# Patient Record
Sex: Female | Born: 1970 | Race: White | Hispanic: No | Marital: Married | Smoking: Current every day smoker
Health system: Southern US, Community
[De-identification: ages and names within clinical notes are randomized; demographics above are authoritative.]

## PROBLEM LIST (undated history)

## (undated) DIAGNOSIS — G459 Transient cerebral ischemic attack, unspecified: Secondary | ICD-10-CM

## (undated) HISTORY — PX: HX TUBAL LIGATION: SHX77

## (undated) HISTORY — PX: HX HYSTERECTOMY: SHX81

## (undated) HISTORY — PX: TONSILLECTOMY: SUR1361

## (undated) HISTORY — PX: HX GALL BLADDER SURGERY/CHOLE: SHX55

## (undated) HISTORY — PX: TUBAL LIGATION: SHX77

## (undated) HISTORY — PX: CHOLECYSTECTOMY: SHX55

---

## 2000-05-27 ENCOUNTER — Emergency Department (HOSPITAL_COMMUNITY): Admission: EM | Admit: 2000-05-27 | Discharge: 2000-05-28 | Payer: Self-pay | Admitting: Emergency Medicine

## 2000-05-27 ENCOUNTER — Encounter: Payer: Self-pay | Admitting: Emergency Medicine

## 2000-05-28 ENCOUNTER — Encounter: Payer: Self-pay | Admitting: Emergency Medicine

## 2000-06-05 ENCOUNTER — Encounter (HOSPITAL_BASED_OUTPATIENT_CLINIC_OR_DEPARTMENT_OTHER): Payer: Self-pay | Admitting: General Surgery

## 2000-06-06 ENCOUNTER — Encounter (HOSPITAL_BASED_OUTPATIENT_CLINIC_OR_DEPARTMENT_OTHER): Payer: Self-pay | Admitting: General Surgery

## 2000-06-06 ENCOUNTER — Ambulatory Visit (HOSPITAL_COMMUNITY): Admission: RE | Admit: 2000-06-06 | Discharge: 2000-06-07 | Payer: Self-pay | Admitting: General Surgery

## 2001-02-18 ENCOUNTER — Emergency Department (HOSPITAL_COMMUNITY): Admission: EM | Admit: 2001-02-18 | Discharge: 2001-02-18 | Payer: Self-pay | Admitting: Emergency Medicine

## 2004-09-20 ENCOUNTER — Ambulatory Visit (HOSPITAL_COMMUNITY): Payer: Self-pay | Admitting: INTERNAL MEDICINE

## 2007-01-02 ENCOUNTER — Ambulatory Visit (INDEPENDENT_AMBULATORY_CARE_PROVIDER_SITE_OTHER): Payer: Self-pay | Admitting: Neurology

## 2007-02-06 ENCOUNTER — Ambulatory Visit (INDEPENDENT_AMBULATORY_CARE_PROVIDER_SITE_OTHER): Payer: BC Managed Care – PPO | Admitting: Neurology

## 2007-03-13 ENCOUNTER — Ambulatory Visit (INDEPENDENT_AMBULATORY_CARE_PROVIDER_SITE_OTHER): Payer: BC Managed Care – PPO | Admitting: Neurology

## 2007-04-10 ENCOUNTER — Encounter (INDEPENDENT_AMBULATORY_CARE_PROVIDER_SITE_OTHER): Payer: BC Managed Care – PPO | Admitting: Neurology

## 2007-05-08 ENCOUNTER — Encounter (INDEPENDENT_AMBULATORY_CARE_PROVIDER_SITE_OTHER): Payer: BC Managed Care – PPO | Admitting: Neurology

## 2007-08-07 ENCOUNTER — Encounter (INDEPENDENT_AMBULATORY_CARE_PROVIDER_SITE_OTHER): Payer: BC Managed Care – PPO | Admitting: Neurology

## 2014-03-23 DIAGNOSIS — Z029 Encounter for administrative examinations, unspecified: Secondary | ICD-10-CM

## 2016-08-11 ENCOUNTER — Emergency Department: Payer: Medicaid - Out of State

## 2016-08-11 ENCOUNTER — Encounter: Payer: Self-pay | Admitting: Emergency Medicine

## 2016-08-11 ENCOUNTER — Emergency Department
Admission: EM | Admit: 2016-08-11 | Discharge: 2016-08-11 | Disposition: A | Discharge status: Home / Self Care | Payer: Medicaid - Out of State | Attending: Emergency Medicine | Admitting: Emergency Medicine

## 2016-08-11 DIAGNOSIS — F17210 Nicotine dependence, cigarettes, uncomplicated: Secondary | ICD-10-CM | POA: Diagnosis not present

## 2016-08-11 DIAGNOSIS — J209 Acute bronchitis, unspecified: Secondary | ICD-10-CM | POA: Insufficient documentation

## 2016-08-11 DIAGNOSIS — Z720 Tobacco use: Secondary | ICD-10-CM

## 2016-08-11 DIAGNOSIS — R05 Cough: Secondary | ICD-10-CM | POA: Diagnosis present

## 2016-08-11 HISTORY — DX: Transient cerebral ischemic attack, unspecified: G45.9

## 2016-08-11 MED ORDER — ALBUTEROL SULFATE HFA 108 (90 BASE) MCG/ACT IN AERS: 1.0000 | INHALATION_SPRAY | Freq: Four times a day (QID) | RESPIRATORY_TRACT | 0 refills | Status: AC | PRN

## 2016-08-11 MED ORDER — NAPROXEN 500 MG PO TABS: 500.0000 mg | ORAL_TABLET | Freq: Two times a day (BID) | ORAL | 0 refills | Status: AC

## 2016-08-11 MED ORDER — PREDNISONE 10 MG PO TABS: ORAL_TABLET | ORAL | 0 refills | Status: AC

## 2016-08-11 MED ORDER — PROMETHAZINE-DM 6.25-15 MG/5ML PO SYRP: 5.0000 mL | ORAL_SOLUTION | Freq: Four times a day (QID) | ORAL | 0 refills | Status: AC | PRN

## 2016-08-11 MED ORDER — AZITHROMYCIN 250 MG PO TABS: ORAL_TABLET | ORAL | 0 refills | Status: AC

## 2016-08-11 NOTE — ED Triage Notes (Signed)
Patient to ED via POV, patient states that she has had cough, congestion, fever and chills x 1 week. Patient states that she is getting up yellow sputum. Patient is not in any distress at this time.

## 2016-08-11 NOTE — ED Provider Notes (Signed)
[facility] Emergency Department Provider Note  ____________________________________________  Time seen: Approximately 4:14 PM  I have reviewed the triage vital signs and the nursing notes.   HISTORY  Chief Complaint Cough    HPI Colleen Valencia is a 45 y.o. female , NAD, presents to the emergency department with productive cough, chest congestion and subjective fevers over the last 7 days. Patient states she has had associated nasal congestion, runny nose and fatigue. Notes she is a long-term tobacco user and has been smoking tobacco cigarettes amounting to approximately one pack per day since she was 45 years old.Denies any chest pain, abdominal pain, nausea, vomiting or diarrhea. Has had shortness of breath with cough. Has been taking over-the-counter medications without alleviation of symptoms.   Past Medical History:  Diagnosis Date  . TIA (transient ischemic attack)     There are no active problems to display for this patient.   Past Surgical History:  Procedure Laterality Date  . CHOLECYSTECTOMY    . TUBAL LIGATION      Prior to Admission medications   Medication Sig Start Date End Date Taking? Authorizing Provider  albuterol (PROVENTIL HFA;VENTOLIN HFA) 108 (90 Base) MCG/ACT inhaler Inhale 1-2 puffs into the lungs every 6 (six) hours as needed for wheezing or shortness of breath. 08/11/16   Takeya Marquis L Adalberto Metzgar, PA-C  azithromycin (ZITHROMAX Z-PAK) 250 MG tablet Take 2 tablets (500 mg) on  Day 1,  followed by 1 tablet (250 mg) once daily on Days 2 through 5. 08/11/16   Tida Saner L Jhett Fretwell, PA-C  naproxen (NAPROSYN) 500 MG tablet Take 1 tablet (500 mg total) by mouth 2 (two) times daily with a meal. 08/11/16   Ravinder Hofland L Allie Ousley, PA-C  predniSONE (DELTASONE) 10 MG tablet Take a daily regimen of 6,5,4,3,2,1 08/11/16   Eldena Dede L Cornie Herrington, PA-C  promethazine-dextromethorphan (PROMETHAZINE-DM) 6.25-15 MG/5ML syrup Take 5 mLs by mouth 4 (four) times daily as needed for cough.  08/11/16   Maleek Craver L Haydan Wedig, PA-C    Allergies Sulfa antibiotics  No family history on file.  Social History Social History  Substance Use Topics  . Smoking status: Current Every Day Smoker  . Smokeless tobacco: Never Used  . Alcohol use No     Review of Systems  Constitutional: Positive subjective fevers and fatigue. No chills, rigors. Eyes: No visual changes. No discharge ENT: Positive nasal congestion, runny nose, sinus pressure. No sore throat. Cardiovascular: No chest pain. Respiratory: Positive cough, chest congestion, shortness of breath. No wheezing. Gastrointestinal: No abdominal pain.  No nausea, vomiting.   Musculoskeletal: Negative for back pain, general myalgias.  Skin: Negative for rash. Neurological: Negative for headaches. 10-point ROS otherwise negative.  ____________________________________________   PHYSICAL EXAM:  VITAL SIGNS: ED Triage Vitals [08/11/16 1516]  Enc Vitals Group     BP 139/76     Pulse Rate 96     Resp 18     Temp 98.2 F (36.8 C)     Temp Source Oral     SpO2 97 %     Weight 211 lb (95.7 kg)     Height 5\' 7"  (1.702 m)     Head Circumference      Peak Flow      Pain Score 8     Pain Loc      Pain Edu?      Excl. in GC?      Constitutional: Alert and oriented. Well appearing and in no acute distress. Eyes: Conjunctivae are normal  Without icterus, injection or discharge Head: Atraumatic. ENT:      Ears: TMs visualized bilaterally with trace effusion but no bulging, erythema or perforation.      Nose: Congestion with clear rhinorrhea.      Mouth/Throat: Mucous membranes are moist. Pharynx without erythema, swelling, exudate. Uvula is midline. Airway is patent. Clear postnasal drip. Neck: Supple with full range of motion. Hematological/Lymphatic/Immunilogical: No cervical lymphadenopathy. Cardiovascular: Normal rate, regular rhythm. Normal S1 and S2.  Good peripheral circulation. Respiratory: Normal respiratory effort without  tachypnea or retractions. Lungs CTAB with breath sounds noted in all lung fields. No wheeze, rhonchi, rales. Neurologic:  Normal speech and language. No gross focal neurologic deficits are appreciated.  Skin:  Skin is warm, dry and intact. No rash noted. Psychiatric: Mood and affect are normal. Speech and behavior are normal. Patient exhibits appropriate insight and judgement.   ____________________________________________   LABS  None ____________________________________________  EKG  None ____________________________________________  RADIOLOGY I, Hope PigeonJami L Elohim Brune, personally viewed and evaluated these images (plain radiographs) as part of my medical decision making, as well as reviewing the written report by the radiologist.  Dg Chest 2 View  Result Date: 08/11/2016 CLINICAL DATA:  Productive cough and intermittent fever 7 days. Patient told she had left lung nodule earlier-year. EXAM: CHEST  2 VIEW COMPARISON:  None. FINDINGS: Lungs are adequately inflated without consolidation or effusion. Cardiomediastinal silhouette is within normal. Bones and soft tissues are within normal. The dense focus over the into superior hilar region on the lateral film which may correlate to the left suprahilar region on the frontal film as cannot exclude a nodule versus prominent vessel in this location. IMPRESSION: No active cardiopulmonary disease. Possible nodule versus prominent vascular structure over the left suprahilar region. Consider noncontrast chest CT on an elective basis. Electronically Signed   By: Elberta Fortisaniel  Boyle M.D.   On: 08/11/2016 15:58    ____________________________________________    PROCEDURES  Procedure(s) performed: None   Procedures   Medications - No data to display   ____________________________________________   INITIAL IMPRESSION / ASSESSMENT AND PLAN / ED COURSE  Pertinent labs & imaging results that were available during my care of the patient were reviewed by  me and considered in my medical decision making (see chart for details).  Clinical Course     Patient's diagnosis is consistent with Acute bronchitis and tobacco use. X-ray results discussed with the patient and she relays that a small nodule was noted about films that were completed by her previous primary care provider earlier this year. Patient has not followed up with her primary care provider at this time as [place] the [place]. Patient advised to to establish care. Due to the patient's smoking history and length of illness, I felt it best to cover for atypical bacterial infection. Patient will be discharged home with prescriptions for albuterol inhaler, azithromycin, naproxen, prednisone and promethazine DM to take as directed. [facility] if symptoms persist past this treatment course. Patient is given ED precautions to return to the ED for any worsening or new symptoms.    ____________________________________________  FINAL CLINICAL IMPRESSION(S) / ED DIAGNOSES  Final diagnoses:  Acute bronchitis, unspecified organism  Tobacco use      NEW MEDICATIONS STARTED DURING THIS VISIT:  Discharge Medication List as of 08/11/2016  4:15 PM    START taking these medications   Details  albuterol (PROVENTIL HFA;VENTOLIN HFA) 108 (90 Base) MCG/ACT inhaler  Inhale 1-2 puffs into the lungs every 6 (six) hours as needed for wheezing or shortness of breath., Starting Sat 08/11/2016, Print    azithromycin (ZITHROMAX Z-PAK) 250 MG tablet Take 2 tablets (500 mg) on  Day 1,  followed by 1 tablet (250 mg) once daily on Days 2 through 5., Print    naproxen (NAPROSYN) 500 MG tablet Take 1 tablet (500 mg total) by mouth 2 (two) times daily with a meal., Starting Sat 08/11/2016, Print    predniSONE (DELTASONE) 10 MG tablet Take a daily regimen of 6,5,4,3,2,1, Print    promethazine-dextromethorphan (PROMETHAZINE-DM) 6.25-15 MG/5ML syrup Take 5 mLs by  mouth 4 (four) times daily as needed for cough., Starting Sat 08/11/2016, Print             Ernestene KielJami L DennisonHagler, PA-C 08/11/16 1720    Jennye MoccasinBrian S Quigley, MD 08/11/16 [phone]

## 2016-08-11 NOTE — ED Notes (Signed)
Pt taken to xray 

## 2016-08-22 ENCOUNTER — Emergency Department: Payer: Medicaid - Out of State

## 2016-08-22 ENCOUNTER — Emergency Department
Admission: EM | Admit: 2016-08-22 | Discharge: 2016-08-22 | Disposition: A | Discharge status: Home / Self Care | Payer: Medicaid - Out of State | Attending: Emergency Medicine | Admitting: Emergency Medicine

## 2016-08-22 ENCOUNTER — Encounter: Payer: Self-pay | Admitting: *Deleted

## 2016-08-22 DIAGNOSIS — F17200 Nicotine dependence, unspecified, uncomplicated: Secondary | ICD-10-CM | POA: Insufficient documentation

## 2016-08-22 DIAGNOSIS — R1010 Upper abdominal pain, unspecified: Secondary | ICD-10-CM | POA: Insufficient documentation

## 2016-08-22 LAB — COMPREHENSIVE METABOLIC PANEL
ALBUMIN: 4.3 g/dL (ref 3.5–5.0)
ALT: 15 U/L (ref 14–54)
ANION GAP: 8 (ref 5–15)
AST: 18 U/L (ref 15–41)
Alkaline Phosphatase: 77 U/L (ref 38–126)
BUN: 13 mg/dL (ref 6–20)
CHLORIDE: 102 mmol/L (ref 101–111)
CO2: 28 mmol/L (ref 22–32)
Calcium: 10.5 mg/dL — ABNORMAL HIGH (ref 8.9–10.3)
Creatinine, Ser: 0.77 mg/dL (ref 0.44–1.00)
GFR calc Af Amer: >60 mL/min (ref >60)
GFR calc non Af Amer: >60 mL/min (ref >60)
GLUCOSE: 99 mg/dL (ref 65–99)
POTASSIUM: 4 mmol/L (ref 3.5–5.1)
Sodium: 138 mmol/L (ref 135–145)
Total Bilirubin: 0.8 mg/dL (ref 0.3–1.2)
Total Protein: 8.2 g/dL — ABNORMAL HIGH (ref 6.5–8.1)

## 2016-08-22 LAB — CBC WITH DIFFERENTIAL/PLATELET
BASOS ABS: 0.1 10*3/uL (ref 0–0.1)
BASOS PCT: 1 %
EOS ABS: 0.4 10*3/uL (ref 0–0.7)
EOS PCT: 3 %
HCT: 42 % (ref 35.0–47.0)
Hemoglobin: 14.7 g/dL (ref 12.0–16.0)
Lymphocytes Relative: 35 %
Lymphs Abs: 4.6 10*3/uL — ABNORMAL HIGH (ref 1.0–3.6)
MCH: 30.5 pg (ref 26.0–34.0)
MCHC: 35 g/dL (ref 32.0–36.0)
MCV: 87.2 fL (ref 80.0–100.0)
MONO ABS: 0.7 10*3/uL (ref 0.2–0.9)
Monocytes Relative: 5 %
Neutro Abs: 7.4 10*3/uL — ABNORMAL HIGH (ref 1.4–6.5)
Neutrophils Relative %: 56 %
PLATELETS: 420 10*3/uL (ref 150–440)
RBC: 4.82 MIL/uL (ref 3.80–5.20)
RDW: 13.5 % (ref 11.5–14.5)
WBC: 13.1 10*3/uL — ABNORMAL HIGH (ref 3.6–11.0)

## 2016-08-22 LAB — URINALYSIS, COMPLETE (UACMP) WITH MICROSCOPIC
Bacteria, UA: NONE SEEN
Bilirubin Urine: NEGATIVE
Glucose, UA: NEGATIVE mg/dL
HGB URINE DIPSTICK: NEGATIVE
KETONES UR: NEGATIVE mg/dL
Leukocytes, UA: NEGATIVE
Nitrite: NEGATIVE
Protein, ur: NEGATIVE mg/dL
Specific Gravity, Urine: 1.003 — ABNORMAL LOW (ref 1.005–1.030)
pH: 6 (ref 5.0–8.0)

## 2016-08-22 LAB — POCT PREGNANCY, URINE: Preg Test, Ur: NEGATIVE

## 2016-08-22 LAB — LIPASE, BLOOD: Lipase: 36 U/L (ref 11–51)

## 2016-08-22 MED ORDER — PANTOPRAZOLE SODIUM 40 MG PO TBEC: 40.0000 mg | DELAYED_RELEASE_TABLET | Freq: Every day | ORAL | 1 refills | Status: AC

## 2016-08-22 MED ORDER — GI COCKTAIL ~~LOC~~
30.0000 mL | Freq: Once | ORAL | Status: AC
  Administered 2016-08-22: 30 mL via ORAL

## 2016-08-22 MED ORDER — HYDROCODONE-ACETAMINOPHEN 5-325 MG PO TABS: 1.0000 | ORAL_TABLET | ORAL | 0 refills | Status: AC | PRN

## 2016-08-22 MED ORDER — IOPAMIDOL (ISOVUE-300) INJECTION 61%
100.0000 mL | Freq: Once | INTRAVENOUS | Status: AC | PRN
  Administered 2016-08-22: 100 mL via INTRAVENOUS
  Filled 2016-08-22: qty 100

## 2016-08-22 MED ORDER — IOPAMIDOL (ISOVUE-300) INJECTION 61%
30.0000 mL | Freq: Once | INTRAVENOUS | Status: AC
  Administered 2016-08-22: 30 mL via ORAL
  Filled 2016-08-22: qty 30

## 2016-08-22 MED ORDER — SODIUM CHLORIDE 0.9 % IV BOLUS (SEPSIS)
1000.0000 mL | Freq: Once | INTRAVENOUS | Status: AC
  Administered 2016-08-22: 1000 mL via INTRAVENOUS

## 2016-08-22 MED ORDER — MORPHINE SULFATE (PF) 4 MG/ML IV SOLN
INTRAVENOUS | Status: AC
  Administered 2016-08-22: 4 mg via INTRAVENOUS
  Filled 2016-08-22: qty 1

## 2016-08-22 MED ORDER — ONDANSETRON HCL 4 MG/2ML IJ SOLN
INTRAMUSCULAR | Status: AC
  Administered 2016-08-22: 4 mg via INTRAVENOUS
  Filled 2016-08-22: qty 2

## 2016-08-22 MED ORDER — GI COCKTAIL ~~LOC~~
ORAL | Status: AC
  Administered 2016-08-22: 30 mL via ORAL
  Filled 2016-08-22: qty 30

## 2016-08-22 MED ORDER — ONDANSETRON HCL 4 MG/2ML IJ SOLN
4.0000 mg | Freq: Once | INTRAMUSCULAR | Status: AC
  Administered 2016-08-22: 4 mg via INTRAVENOUS

## 2016-08-22 MED ORDER — MORPHINE SULFATE (PF) 4 MG/ML IV SOLN
4.0000 mg | Freq: Once | INTRAVENOUS | Status: AC
Start: 2016-08-22 — End: 2016-08-22
  Administered 2016-08-22: 4 mg via INTRAVENOUS

## 2016-08-22 MED ORDER — HYDROMORPHONE HCL 1 MG/ML IJ SOLN
1.0000 mg | Freq: Once | INTRAMUSCULAR | Status: AC
  Administered 2016-08-22: 1 mg via INTRAVENOUS

## 2016-08-22 MED ORDER — HYDROMORPHONE HCL 1 MG/ML IJ SOLN
INTRAMUSCULAR | Status: AC
  Administered 2016-08-22: 1 mg via INTRAVENOUS
  Filled 2016-08-22: qty 1

## 2016-08-22 NOTE — ED Notes (Signed)
Pt ambulated to bathroom with steady gait. No complaints at this time.

## 2016-08-22 NOTE — ED Triage Notes (Signed)
Pt reports abd pain.  No vag bleeding. Pt also has blood in stools today.  No vomiting or diarrhea.  Pt alert.

## 2016-08-22 NOTE — ED Notes (Signed)
CT tech notified pt finished drinking contrast.  

## 2016-08-22 NOTE — ED Notes (Signed)
Patient transported to CT via stretcher.

## 2016-08-22 NOTE — ED Notes (Signed)
ED Provider at bedside. 

## 2016-08-22 NOTE — ED Provider Notes (Signed)
[facility] Emergency Department Provider Note  Time seen: 7:47 PM  I have reviewed the triage vital signs and the nursing notes.   HISTORY  Chief Complaint Abdominal Pain    HPI Colleen Valencia is a 45 y.o. female who presents the emergency department with upper abdominal pain. According to the patient for the past 4 days she has been experiencing upper abdominal pain. She states she has been having multiple bowel movements although denies diarrhea. States today she noticed bright red blood in the toilet. Patient describes the upper abdominal pain as a 7/10 dull aching pain. Denies nausea, vomiting or diarrhea. Last bowel movement was today. Denies dysuria. Denies alcohol use. Patient is status post cholecystectomy many years ago per patient.  Past Medical History:  Diagnosis Date  . TIA (transient ischemic attack)     There are no active problems to display for this patient.   Past Surgical History:  Procedure Laterality Date  . CHOLECYSTECTOMY    . TUBAL LIGATION      Prior to Admission medications   Medication Sig Start Date End Date Taking? Authorizing Provider  albuterol (PROVENTIL HFA;VENTOLIN HFA) 108 (90 Base) MCG/ACT inhaler Inhale 1-2 puffs into the lungs every 6 (six) hours as needed for wheezing or shortness of breath. 08/11/16   Jami L Hagler, PA-C  azithromycin (ZITHROMAX Z-PAK) 250 MG tablet Take 2 tablets (500 mg) on  Day 1,  followed by 1 tablet (250 mg) once daily on Days 2 through 5. 08/11/16   Jami L Hagler, PA-C  naproxen (NAPROSYN) 500 MG tablet Take 1 tablet (500 mg total) by mouth 2 (two) times daily with a meal. 08/11/16   Jami L Hagler, PA-C  predniSONE (DELTASONE) 10 MG tablet Take a daily regimen of 6,5,4,3,2,1 08/11/16   Jami L Hagler, PA-C  promethazine-dextromethorphan (PROMETHAZINE-DM) 6.25-15 MG/5ML syrup Take 5 mLs by mouth 4 (four) times daily as needed for cough. 08/11/16   Jami L Hagler, PA-C    Allergies  Allergen  Reactions  . Sulfa Antibiotics     No family history on file.  Social History Social History  Substance Use Topics  . Smoking status: Current Every Day Smoker  . Smokeless tobacco: Never Used  . Alcohol use No    Review of Systems Constitutional: Negative for fever Cardiovascular: Negative for chest pain. Respiratory: Negative for shortness of breath. Gastrointestinal: Upper abdominal pain. Negative for vomiting or diarrhea. Genitourinary: Negative for dysuria. Neurological: Negative for headache 10-point ROS otherwise negative.   ____________________________________________   PHYSICAL EXAM:  VITAL SIGNS: ED Triage Vitals  Enc Vitals Group     BP 08/22/16 1916 140/66     Pulse Rate 08/22/16 1916 84     Resp 08/22/16 1916 18     Temp 08/22/16 1916 98.3 F (36.8 C)     Temp Source 08/22/16 1916 Oral     SpO2 08/22/16 1916 98 %     Weight --      Height 08/22/16 1916 5\' 7"  (1.702 m)     Head Circumference --      Peak Flow --      Pain Score 08/22/16 1919 8     Pain Loc --      Pain Edu? --      Excl. in GC? --     Constitutional: Alert and oriented. Well appearing and in no distress. Eyes: Normal exam ENT   Head: Normocephalic and atraumatic.   Mouth/Throat: Mucous membranes are moist. Cardiovascular:  Normal rate, regular rhythm. No murmur Respiratory: Normal respiratory effort without tachypnea nor retractions. Breath sounds are clear Gastrointestinal: Soft, moderate upper abdominal tenderness palpation especially in the epigastrium, no rebound or guarding. No distention. Musculoskeletal: Nontender with normal range of motion in all extremities.  Neurologic:  Normal speech and language. No gross focal neurologic deficits  Skin:  Skin is warm, dry and intact.  Psychiatric: Mood and affect are normal.   ____________________________________________   RADIOLOGY  CT is negative  ____________________________________________   INITIAL IMPRESSION /  ASSESSMENT AND PLAN / ED COURSE  Pertinent labs & imaging results that were available during my care of the patient were reviewed by me and considered in my medical decision making (see chart for details).  Patient presents for upper abdominal pain, patient also states bloody stool tonight after a bowel movement. Rectal exam shows light brown stool, patient does have large external hemorrhoids, stool is slightly guaiac positive. I suspect likely hemorrhoidal bleeding given the bright red color and light brown stool. Patient's labs are largely within normal limits including lipase and LFTs. CT scan of the abdomen is negative for acute abnormality. At this time it is unclear the cause of the patient's abdominal discomfort. We will discharge him Protonix, Norco, and have the patient follow up with GI medicine. I discussed my normal abdominal pain return precautions with the patient. She is agreeable.  ____________________________________________   FINAL CLINICAL IMPRESSION(S) / ED DIAGNOSES  Upper abdominal pain    Minna AntisKevin Yailin Biederman, MD 08/22/16 2230

## 2017-06-27 IMAGING — CT CT ABD-PELV W/ CM
2 of 5 series · 16 of 46 positions shown, 18 images · IV contrast (APPLIED)
Comparison: Report dated 05/27/2000

CLINICAL DATA: Generalized abdominal pain for 4 days

EXAM:
CT ABDOMEN AND PELVIS WITH CONTRAST
TECHNIQUE: Multidetector CT imaging of the abdomen and pelvis was performed
using the standard protocol following bolus administration of
intravenous contrast.
CONTRAST:  100mL NILG51-YUU IOPAMIDOL (NILG51-YUU) INJECTION 61%

[Series 2: routine abd/pel with · axial · 0.98mm/px · z∈[-1049,-619]mm · 13 of 98 slices shown, 15 images]
[im 6/98  soft-tissue]
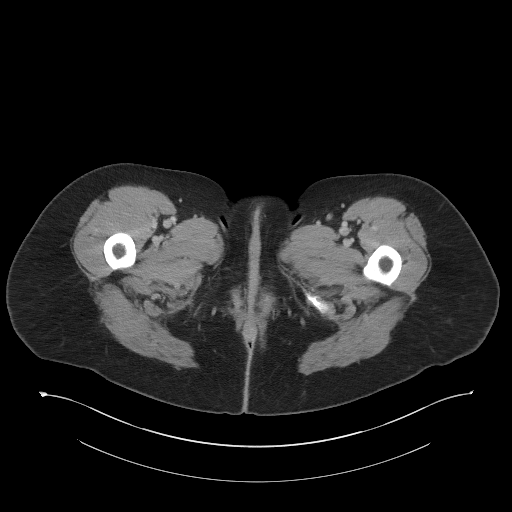
[im 6/98  bone]
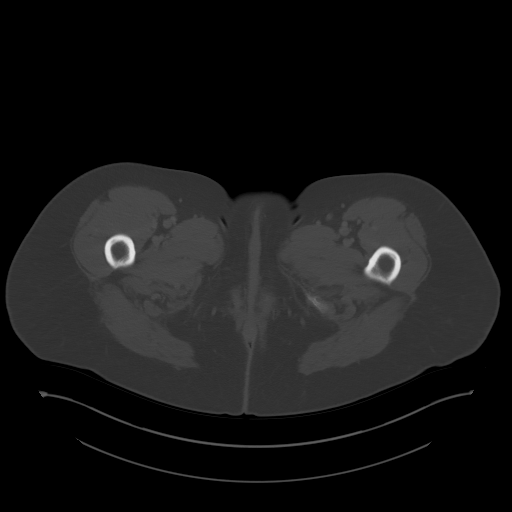
[im 16/98  soft-tissue]
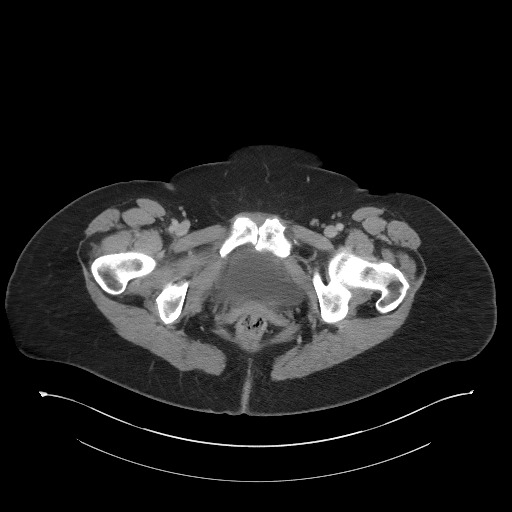
[im 21/98  soft-tissue]
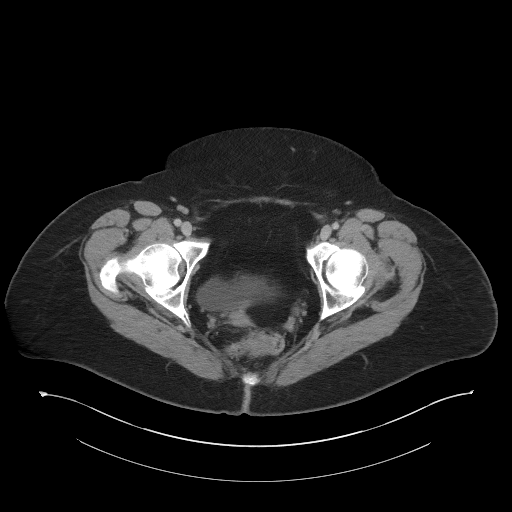
[im 26/98  soft-tissue]
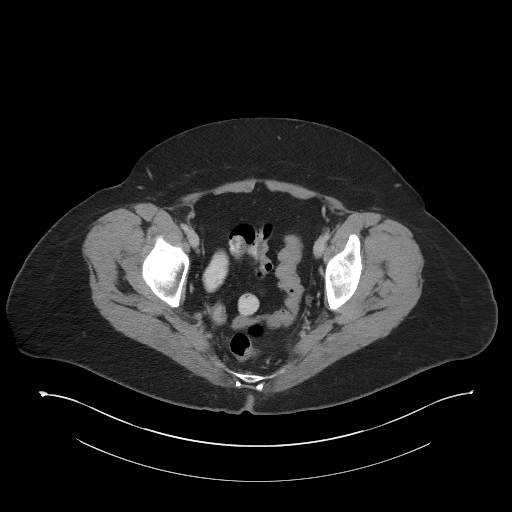
[im 36/98  soft-tissue]
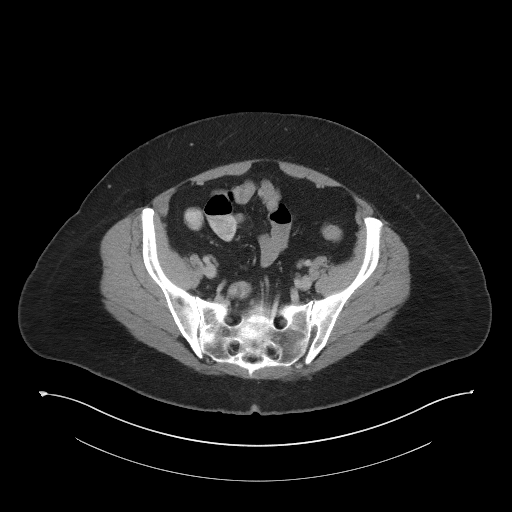
[im 41/98  soft-tissue]
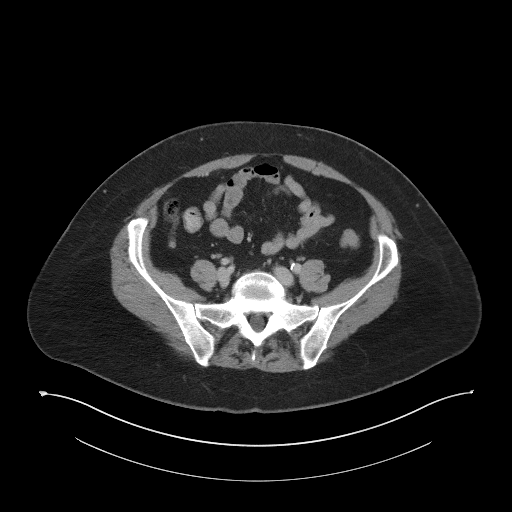
[im 52/98  soft-tissue]
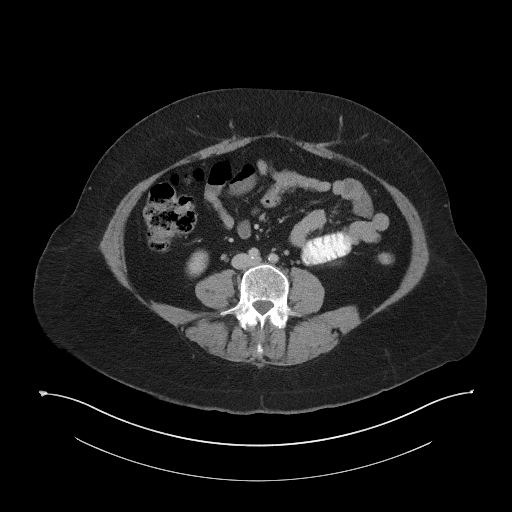
[im 57/98  soft-tissue]
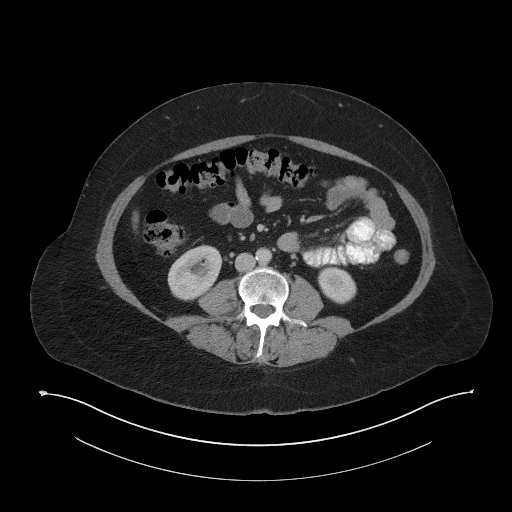
[im 62/98  soft-tissue]
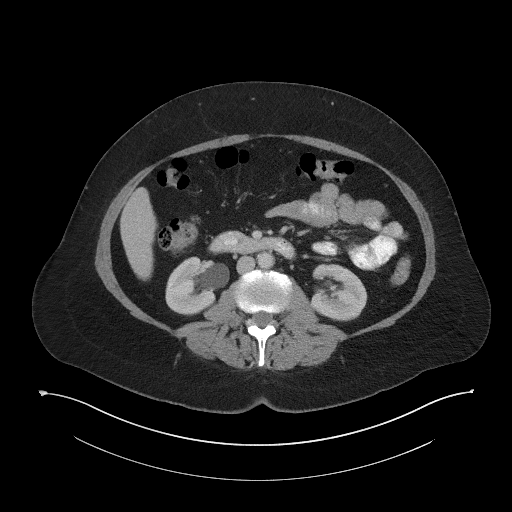
[im 62/98  bone]
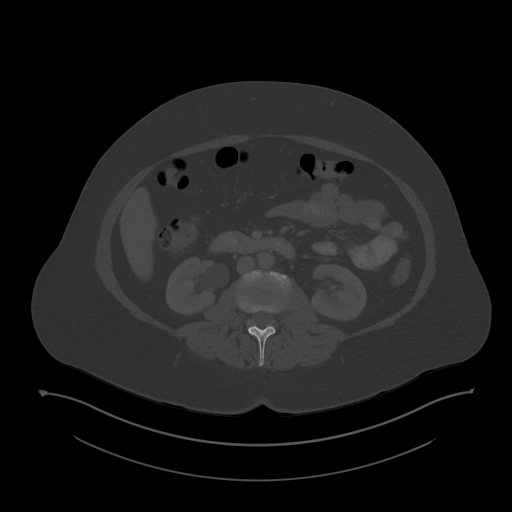
[im 72/98  soft-tissue]
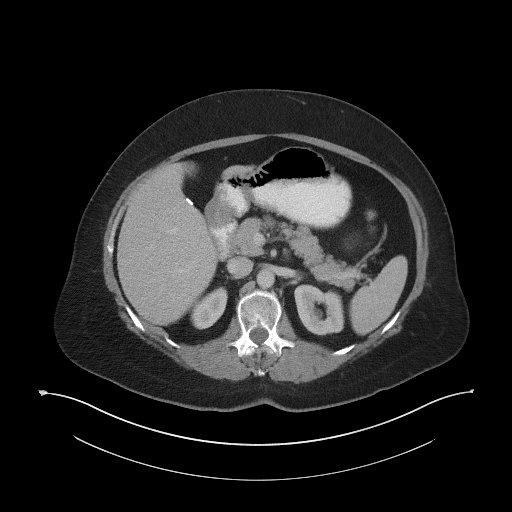
[im 77/98  soft-tissue]
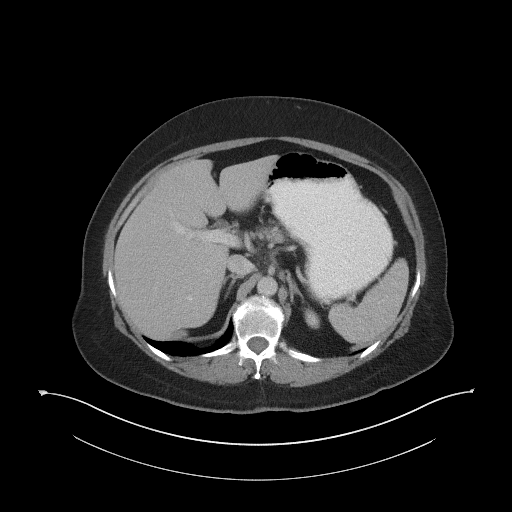
[im 82/98  soft-tissue]
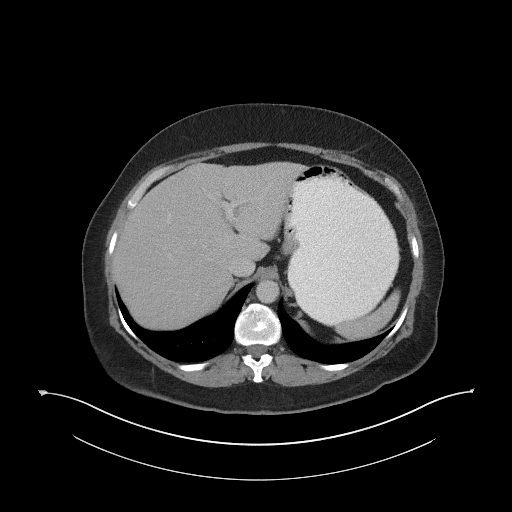
[im 92/98  soft-tissue]
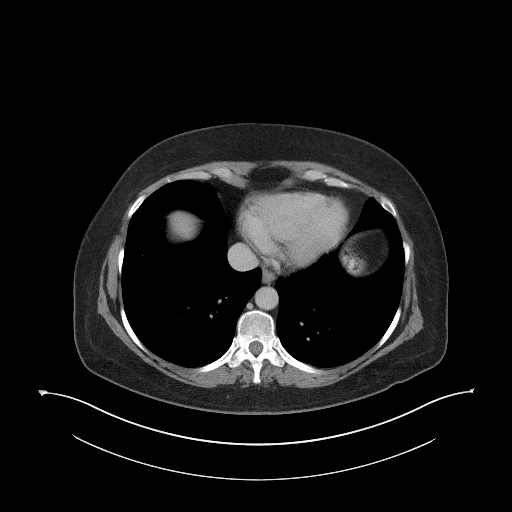

[Series 5: coronal st · coronal · 0.79mm/px · 3 of 101 slices shown]
[im 34/101  soft-tissue]
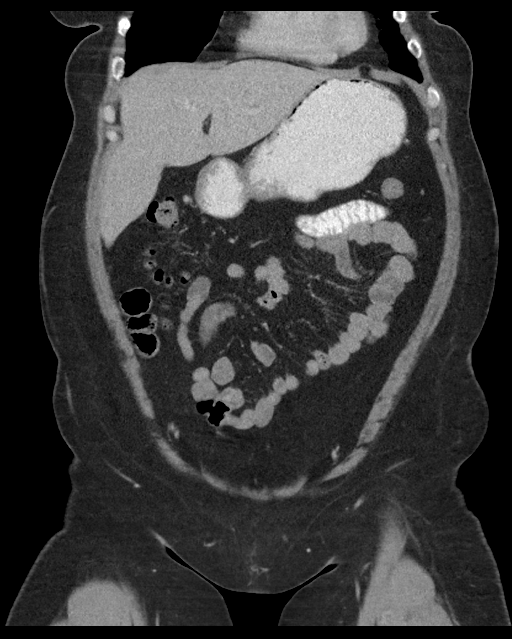
[im 45/101  soft-tissue]
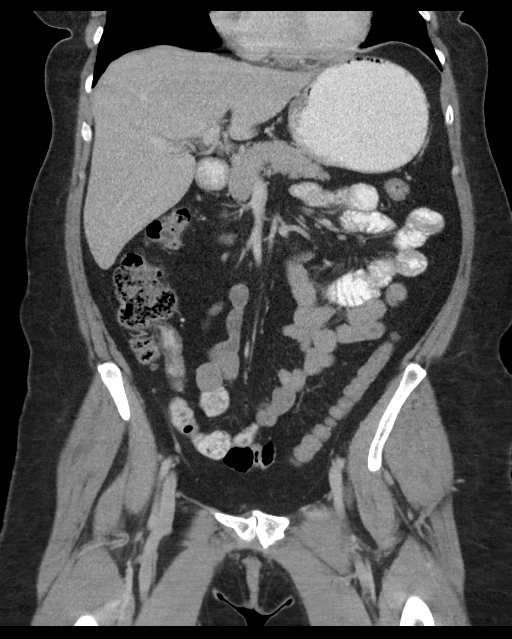
[im 56/101  soft-tissue]
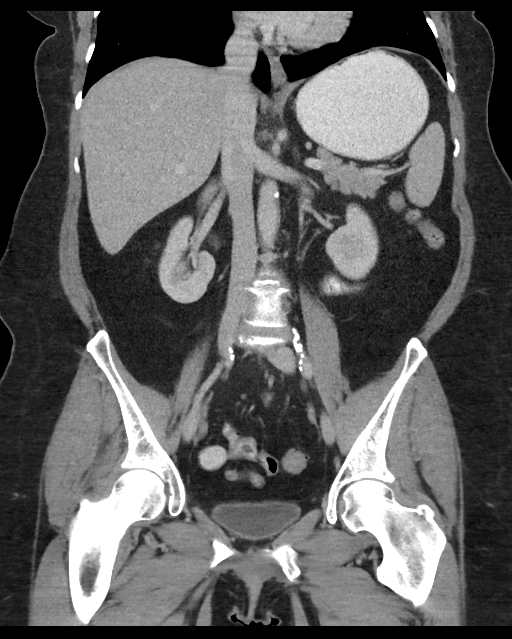

[16 of 46 positions shown; findings below may reference images not displayed]

FINDINGS: Lower chest: Visualized lung bases demonstrate no acute
consolidation or pleural effusion. Heart size nonenlarged.

Hepatobiliary: No focal hepatic abnormality. Surgical clips in the
gallbladder fossa. No significant biliary dilatation.

Pancreas: Unremarkable. No pancreatic ductal dilatation or
surrounding inflammatory changes.

Spleen: Normal in size without focal abnormality.

Adrenals/Urinary Tract: Adrenal glands are unremarkable. Kidneys are
normal, without renal calculi, focal lesion, or hydronephrosis.
Bladder is unremarkable.

Stomach/Bowel: Stomach is within normal limits. Appendix appears
normal. No evidence of bowel wall thickening, distention, or
inflammatory changes.

Vascular/Lymphatic: Aortic atherosclerosis. No enlarged abdominal or
pelvic lymph nodes.

Reproductive: Status post hysterectomy. No adnexal masses.

Other: No abdominal wall hernia or abnormality. No abdominopelvic
ascites.

Musculoskeletal: No acute or significant osseous findings.
IMPRESSION: No CT evidence for acute intra- abdominal or pelvic pathology.

## 2021-06-29 ENCOUNTER — Encounter (HOSPITAL_BASED_OUTPATIENT_CLINIC_OR_DEPARTMENT_OTHER): Payer: Self-pay | Admitting: Otolaryngology

## 2021-06-29 ENCOUNTER — Other Ambulatory Visit: Payer: Self-pay

## 2021-06-29 ENCOUNTER — Ambulatory Visit: Payer: MEDICAID | Attending: Otolaryngology | Admitting: Otolaryngology

## 2021-06-29 VITALS — BP 109/59 | HR 79 | Temp 99.1°F | Resp 18 | Ht 67.0 in | Wt 246.0 lb

## 2021-06-29 DIAGNOSIS — J359 Chronic disease of tonsils and adenoids, unspecified: Secondary | ICD-10-CM

## 2021-06-29 DIAGNOSIS — F17210 Nicotine dependence, cigarettes, uncomplicated: Secondary | ICD-10-CM | POA: Insufficient documentation

## 2021-06-29 DIAGNOSIS — Z809 Family history of malignant neoplasm, unspecified: Secondary | ICD-10-CM | POA: Insufficient documentation

## 2021-06-29 NOTE — Procedures (Signed)
ENT, [facility]  [address]  [place] [address]  Operated by [facility]  Procedure Note    Name: Denise Miranda MRN:  [id number]   Date: 06/29/2021 Age: 50 y.o.       [address] - LARYNGOSCOPY, FLEXIBLE DIAGNOSTIC (AMB ONLY)    Date/Time: 06/29/2021 1:25 PM  Performed by: Warnell Bureau, MD  Authorized by: Warnell Bureau, MD     Time Out:     Immediately before the procedure, a time out was called:  Yes    Patient verified:  Yes    Procedure Verified:  Yes    Site Verified:  Yes    Flexible nasal laryngoscopy was performed through the right naris after spraying with topical lidocaine with phenylephrine. The nasal cavity, nasopharynx, oropharynx, and larynx were within normal limits    Warnell Bureau, MD

## 2021-06-29 NOTE — H&P (Signed)
PATIENT NAME:  Denise Miranda  MRN:  [id number]  DOB:  09-12-1970  DATE OF SERVICE: 06/29/2021    Chief Complaint:  New Patient (Tonsillar lesion )      HPI:  Denise Miranda is a 50 y.o. female who was referred to me for a finding on CT. Patient reports that she was seen at her local ER for throat pain and full body myalgias. A CT scan was done and reportedly showed a base of tongue lesion. Patient is much improved from her symptoms. Denies difficulty with eating drinking or breathing.    She is a 1 ppd smoker for 30 years. Medical history is negative. Past history of tonsillectomy.    Past Medical History:  History reviewed. No pertinent past medical history.  Past Medical History was reviewed and is negative for contributory conditions      Past Surgical History:  Past Surgical History:   Procedure Laterality Date   . HX CHOLECYSTECTOMY     . HX HYSTERECTOMY     . HX TUBAL LIGATION     . TONSILLECTOMY             Family History:  Family Medical History:     Problem Relation (Age of Onset)    Cancer Father, Brother    Diabetes Mother, Father, Sister, Brother    Heart Disease Mother, Father    Hypertension (High Blood Pressure) Mother, Father, Sister, Brother            Social History:  Social History     Tobacco Use   Smoking Status Every Day   . Packs/day: 1.00   . Years: 35.00   . Pack years: 35.00   . Types: Cigarettes   Smokeless Tobacco Never     Social History     Substance and Sexual Activity   Alcohol Use Yes    Comment: Social     Social History     Occupational History   . Not on file       Medications:  Outpatient Medications Marked as Taking for the 06/29/21 encounter (Office Visit) with Dwana Curd, MD   Medication Sig   . ALPRAZolam (XANAX) 0.25 mg Oral Tablet alprazolam 0.25 mg tablet   TAKE 1 TABLET BY MOUTH TWICE DAILY FOR 7 DAYS AS NEEDED   . aspirin (ECOTRIN) 81 mg Oral Tablet, Delayed Release (E.C.) Every one hour   . pantoprazole (PROTONIX) 20 mg Oral Tablet, Delayed Release (E.C.)  pantoprazole 20 mg tablet,delayed release       Allergies:  Allergies   Allergen Reactions   . Sulfa (Sulfonamides)        Review of Systems:                                                        All other systems reviewed and found to be negative.    Physical Exam:  Constitutional:  Blood pressure (!) 109/59, pulse 79, temperature 37.3 C (99.1 F), temperature source Thermal Scan, resp. rate 18, height 1.702 m (_0 ), weight 112 kg (246 lb 0.5 oz), SpO2 98 %.  Body mass index is 38.53 kg/m.   General Appearance: healthy  ______________________________________________________________________________  Eyes: EOM's intact.   ______________________________________________________________________________  ENT:  Head and Face: Normocephalic.  Face symmetric.   Ears: Pinna in normal  position, symmetric.   Nose:  External pyramid without significant deviation.   Oral Cavity: No suspicious lesions.   Oropharynx: No mucosal lesions, masses, or pharyngeal asymmetry.   Larynx: Voice normal.   Neck:  No palpable thyroid or salivary gland masses.   ______________________________________________________________________________  Heme/Lymph:  No palpable cervical adenopathy   ______________________________________________________________________________  Cardiovascular:  Deferred.   ______________________________________________________________________________  Resp: No apparent stridorous breathing. No acute distress.   ______________________________________________________________________________  Skin:    Face: No lesions   ______________________________________________________________________________  Neurologic:    Cranial nerves:  CN 10, 12 intact  ______________________________________________________________________________  Psychiatric:  Alert and oriented x 3.  ______________________________________________________________________________    Procedure:  ENT, [address] [place]  [place] [address]  Operated by [facility]  Procedure Note    Name: Denise Miranda MRN:  [id number]   Date: 06/29/2021 Age: 50 y.o.       [address] - LARYNGOSCOPY, FLEXIBLE DIAGNOSTIC (AMB ONLY)    Date/Time: 06/29/2021 1:25 PM  Performed by: Dwana Curd, MD  Authorized by: Dwana Curd, MD     Time Out:     Immediately before the procedure, a time out was called:  Yes    Patient verified:  Yes    Procedure Verified:  Yes    Site Verified:  Yes    Flexible nasal laryngoscopy was performed through the right naris after spraying with topical lidocaine with phenylephrine. The nasal cavity, nasopharynx, oropharynx, and larynx were within normal limits    Dwana Curd, MD      Data Reviewed:  Outside CT neck from Oct. 18, 2022 was viewed on Synapse. My interpretation is nil acute.    Assessment:  1. Lesion of tonsil        Plan:  Orders Placed This Encounter   . 51761 - LARYNGOSCOPY, FLEXIBLE DIAGNOSTIC (AMB ONLY)     No abnormality on exam. Follow up as needed.    Dwana Curd, MD 06/29/2021, 13:29    PCP:  Velta Addison, PA-C  [facility] [address]  [place] [address]   REF:  [address]uss[address], [place]  [place],  [place] [address]

## 2024-08-12 ENCOUNTER — Ambulatory Visit (HOSPITAL_BASED_OUTPATIENT_CLINIC_OR_DEPARTMENT_OTHER): Payer: Self-pay | Admitting: Family
